# Patient Record
Sex: Female | Born: 1995 | Race: White | Hispanic: No | Marital: Single | Smoking: Never smoker
Health system: Southern US, Community
[De-identification: ages and names within clinical notes are randomized; demographics above are authoritative.]

---
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
## 2020-09-06 ENCOUNTER — Emergency Department (HOSPITAL_BASED_OUTPATIENT_CLINIC_OR_DEPARTMENT_OTHER)
Admission: EM | Admit: 2020-09-06 | Discharge: 2020-09-06 | Disposition: A | Discharge status: Home / Self Care | Payer: Managed Care, Other (non HMO) | Attending: Emergency Medicine | Admitting: Emergency Medicine

## 2020-09-06 ENCOUNTER — Other Ambulatory Visit: Payer: Self-pay

## 2020-09-06 ENCOUNTER — Emergency Department (HOSPITAL_BASED_OUTPATIENT_CLINIC_OR_DEPARTMENT_OTHER): Payer: Managed Care, Other (non HMO)

## 2020-09-06 ENCOUNTER — Encounter (HOSPITAL_BASED_OUTPATIENT_CLINIC_OR_DEPARTMENT_OTHER): Payer: Self-pay

## 2020-09-06 DIAGNOSIS — Z3A01 Less than 8 weeks gestation of pregnancy: Secondary | ICD-10-CM | POA: Insufficient documentation

## 2020-09-06 DIAGNOSIS — R1033 Periumbilical pain: Secondary | ICD-10-CM | POA: Insufficient documentation

## 2020-09-06 DIAGNOSIS — O26891 Other specified pregnancy related conditions, first trimester: Secondary | ICD-10-CM | POA: Diagnosis not present

## 2020-09-06 DIAGNOSIS — R1032 Left lower quadrant pain: Secondary | ICD-10-CM | POA: Diagnosis not present

## 2020-09-06 LAB — COMPREHENSIVE METABOLIC PANEL
ALT: 13 U/L (ref 0–44)
AST: 16 U/L (ref 15–41)
Albumin: 4.7 g/dL (ref 3.5–5.0)
Alkaline Phosphatase: 41 U/L (ref 38–126)
Anion gap: 9 (ref 5–15)
BUN: 8 mg/dL (ref 6–20)
CO2: 24 mmol/L (ref 22–32)
Calcium: 9.1 mg/dL (ref 8.9–10.3)
Chloride: 104 mmol/L (ref 98–111)
Creatinine, Ser: 0.69 mg/dL (ref 0.44–1.00)
GFR, Estimated: >60 mL/min (ref >60)
Glucose, Bld: 87 mg/dL (ref 70–99)
Potassium: 3.9 mmol/L (ref 3.5–5.1)
Sodium: 137 mmol/L (ref 135–145)
Total Bilirubin: 0.6 mg/dL (ref 0.3–1.2)
Total Protein: 7.8 g/dL (ref 6.5–8.1)

## 2020-09-06 LAB — URINALYSIS, ROUTINE W REFLEX MICROSCOPIC
Bilirubin Urine: NEGATIVE
Glucose, UA: NEGATIVE mg/dL
Hgb urine dipstick: NEGATIVE
Ketones, ur: 5 mg/dL — AB
Leukocytes,Ua: NEGATIVE
Nitrite: NEGATIVE
Protein, ur: NEGATIVE mg/dL
Specific Gravity, Urine: 1.015 (ref 1.005–1.030)
pH: 6 (ref 5.0–8.0)

## 2020-09-06 LAB — CBC
HCT: 39.6 % (ref 36.0–46.0)
Hemoglobin: 13.2 g/dL (ref 12.0–15.0)
MCH: 31.3 pg (ref 26.0–34.0)
MCHC: 33.3 g/dL (ref 30.0–36.0)
MCV: 93.8 fL (ref 80.0–100.0)
Platelets: 190 10*3/uL (ref 150–400)
RBC: 4.22 MIL/uL (ref 3.87–5.11)
RDW: 12.6 % (ref 11.5–15.5)
WBC: 8.8 10*3/uL (ref 4.0–10.5)
nRBC: 0 % (ref 0.0–0.2)

## 2020-09-06 LAB — WET PREP, GENITAL
Sperm: NONE SEEN
Trich, Wet Prep: NONE SEEN
Yeast Wet Prep HPF POC: NONE SEEN

## 2020-09-06 LAB — HCG, QUANTITATIVE, PREGNANCY: hCG, Beta Chain, Quant, S: 266 m[IU]/mL — ABNORMAL HIGH (ref <5)

## 2020-09-06 LAB — PREGNANCY, URINE: Preg Test, Ur: POSITIVE — AB

## 2020-09-06 LAB — LIPASE, BLOOD: Lipase: 24 U/L (ref 11–51)

## 2020-09-06 MED ORDER — PRENATAL COMPLETE 14-0.4 MG PO TABS: 1.0000 | ORAL_TABLET | Freq: Every day | ORAL | 0 refills | Status: AC

## 2020-09-06 NOTE — Discharge Instructions (Addendum)
Your pregnancy test today was positive.  Ultrasound did not show any evidence of an ectopic pregnancy but also did not show any intrauterine gestation and I believe this is most likely because it is early on in the pregnancy. You will need to follow-up at the women's clinic listed below for repeat blood work as well as a repeat ultrasound.  Please call to make an appointment. If your symptoms worsen and if you have any worsening abdominal pain, vaginal bleeding, lightheadedness or pelvic pain, please go to the MAU at the [facility] with the address listed below.

## 2020-09-06 NOTE — ED Triage Notes (Signed)
Pt reports she was sent here from [facility] to r/o ectopic pregnancy. Pt reports LL abd pain that started yesterday.  Pt reported that she was not aware of pregnancy prior to her arriving at [facility].

## 2020-09-06 NOTE — ED Provider Notes (Signed)
[facility] EMERGENCY DEPARTMENT Provider Note   CSN: [id number] Arrival date & time: 09/06/20  1146     History Chief Complaint  Patient presents with  . Abdominal Pain    R/o ectopic pregnacy    Kelsey Navarro is a 25 y.o. female who presents to ED with a chief complaint of abdominal pain.  States that yesterday started developing left middle and left lower quadrant abdominal pain with 1 episode of nonbloody, nonbilious emesis.  She tried taking Pepto-Bismol but she feels like this is what caused her to vomit.  Denies any vaginal discharge, abnormal vaginal bleeding or diarrhea.  She presented to urgent care, found to have a positive pregnancy test and was sent to the ER to rule out ectopic.  She states that she had 1 other pregnancy at the age of 89 which she states was not carried to term.  She denies history of ectopic pregnancy.  Last menstrual cycle ended on 08/30/2020.  No sick contacts with similar symptoms.  No concern for STDs.  No shortness of breath, chest pain, dysuria.  HPI     History reviewed. No pertinent past medical history.  There are no problems to display for this patient.   History reviewed. No pertinent surgical history.   OB History   No obstetric history on file.     History reviewed. No pertinent family history.  Social History   Tobacco Use  . Smoking status: Never Smoker  . Smokeless tobacco: Never Used  Vaping Use  . Vaping Use: Never used  Substance Use Topics  . Alcohol use: Yes    Comment: rarely  . Drug use: Never    Home Medications Prior to Admission medications   Medication Sig Start Date End Date Taking? Authorizing Provider  norgestimate-ethinyl estradiol (ORTHO-CYCLEN) 0.25-35 MG-MCG tablet Take 1 tablet by mouth daily.   Yes [provider]  Prenatal Vit-Fe Fumarate-FA (PRENATAL COMPLETE) 14-0.4 MG TABS Take 1 tablet by mouth daily. 09/06/20  Yes Aquilla Voiles, PA-C    Allergies    Patient has no  allergy information on record.  Review of Systems   Review of Systems  Constitutional: Negative for appetite change, chills and fever.  HENT: Negative for ear pain, rhinorrhea, sneezing and sore throat.   Eyes: Negative for photophobia and visual disturbance.  Respiratory: Negative for cough, chest tightness, shortness of breath and wheezing.   Cardiovascular: Negative for chest pain and palpitations.  Gastrointestinal: Positive for abdominal pain and vomiting. Negative for blood in stool, constipation, diarrhea and nausea.  Genitourinary: Negative for dysuria, hematuria and urgency.  Musculoskeletal: Negative for myalgias.  Skin: Negative for rash.  Neurological: Negative for dizziness, weakness and light-headedness.    Physical Exam Updated Vital Signs BP 121/72 (BP Location: Right Arm)   Pulse 79   Temp 98 F (36.7 C) (Oral)   Resp 18   Ht 5\' 5"  (1.651 m)   Wt 68.2 kg   LMP 08/20/2020   SpO2 100%   BMI 25.02 kg/m   Physical Exam Vitals and nursing note reviewed. Exam conducted with a chaperone present.  Constitutional:      General: She is not in acute distress.    Appearance: She is well-developed and well-nourished.  HENT:     Head: Normocephalic and atraumatic.     Nose: Nose normal.  Eyes:     General: No scleral icterus.       Left eye: No discharge.     Extraocular Movements: EOM  normal.     Conjunctiva/sclera: Conjunctivae normal.  Cardiovascular:     Rate and Rhythm: Normal rate and regular rhythm.     Pulses: Intact distal pulses.     Heart sounds: Normal heart sounds. No murmur heard. No friction rub. No gallop.   Pulmonary:     Effort: Pulmonary effort is normal. No respiratory distress.     Breath sounds: Normal breath sounds.  Abdominal:     General: Bowel sounds are normal. There is no distension.     Palpations: Abdomen is soft.     Tenderness: There is abdominal tenderness in the periumbilical area. There is no guarding.  Genitourinary:     Vagina: Vaginal discharge present.     Cervix: No cervical motion tenderness.     Uterus: Not tender.      Comments: Pelvic exam: normal external genitalia without evidence of trauma. VULVA: normal appearing vulva with no masses, tenderness or lesion. VAGINA: normal appearing vagina with normal color and discharge, no lesions. CERVIX: normal appearing cervix without lesions, cervical motion tenderness absent, cervical os closed with out purulent discharge; No vaginal discharge. Wet prep and DNA probe for chlamydia and GC obtained.   ADNEXA: normal adnexa in size, nontender and no masses UTERUS: uterus is normal size, shape, consistency and nontender.   Musculoskeletal:        General: No edema. Normal range of motion.     Cervical back: Normal range of motion and neck supple.  Skin:    General: Skin is warm and dry.     Findings: No rash.  Neurological:     Mental Status: She is alert.     Motor: No abnormal muscle tone.     Coordination: Coordination normal.  Psychiatric:        Mood and Affect: Mood and affect normal.     ED Results / Procedures / Treatments   Labs (all labs ordered are listed, but only abnormal results are displayed) Labs Reviewed  WET PREP, GENITAL - Abnormal; Notable for the following components:      Result Value   Clue Cells Wet Prep HPF POC PRESENT (*)    WBC, Wet Prep HPF POC RARE (*)    All other components within normal limits  URINALYSIS, ROUTINE W REFLEX MICROSCOPIC - Abnormal; Notable for the following components:   Ketones, ur 5 (*)    All other components within normal limits  PREGNANCY, URINE - Abnormal; Notable for the following components:   Preg Test, Ur POSITIVE (*)    All other components within normal limits  HCG, QUANTITATIVE, PREGNANCY - Abnormal; Notable for the following components:   hCG, Beta Chain, Quant, S 266 (*)    All other components within normal limits  URINE CULTURE  LIPASE, BLOOD  COMPREHENSIVE METABOLIC PANEL  CBC   GC/CHLAMYDIA PROBE AMP ([place]) NOT AT [facility]    EKG None  Radiology US OB LESS THAN 14 WEEKS WITH OB TRANSVAGINAL  Result Date: 09/06/2020 CLINICAL DATA:  Left lower quadrant pain.  Positive pregnancy test. EXAM: OBSTETRIC <14 WK [place] AND TRANSVAGINAL OB US TECHNIQUE: Both transabdominal and transvaginal ultrasound examinations were performed for complete evaluation of the gestation as well as the maternal uterus, adnexal regions, and pelvic cul-de-sac. Transvaginal technique was performed to assess early pregnancy. COMPARISON:  None. FINDINGS: Intrauterine gestational sac: None Maternal uterus/adnexae: Endometrial thickness measures 5 mm. No fibroids identified. Small left ovarian corpus luteum noted. Right ovary is normal in appearance. No adnexal masses are  identified. A small amount of free fluid is seen in the pelvic cul-de-sac. IMPRESSION: Pregnancy of unknown anatomic location (no intrauterine gestational sac or adnexal mass identified). Differential diagnosis includes recent spontaneous abortion, IUP too early to visualize, and non-visualized ectopic pregnancy. Recommend correlation with serial beta-hCG levels, and follow up US if warranted clinically. Electronically Signed   By: Danae OrleansJohn A Stahl M.D.   On: 09/06/2020 14:40    Procedures Procedures (including critical care time)  Medications Ordered in ED Medications - No data to display  ED Course  I have reviewed the triage vital signs and the nursing notes.  Pertinent labs & imaging results that were available during my care of the patient were reviewed by me and considered in my medical decision making (see chart for details).    MDM Rules/Calculators/A&P                          25 year old female presenting to the ED with a chief complaint of abdominal pain.  Sent to the ER from urgent care after having a positive pregnancy test and sent to rule out ectopic.  Lower left-sided and middle abdominal pain since yesterday with 1  episode of nonbloody, nonbilious emesis which she feels like was from the Pepto-Bismol she tried to take.  She denies any pelvic complaints, vaginal discharge, abnormal vaginal bleeding, dysuria or diarrhea.  Pelvic exam without any cervical motion tenderness, adnexal tenderness or uterine tenderness.  Abdomen is tender in the periumbilical area without rebound or guarding.  Pregnancy test here is positive with an hCG slightly elevated at 266.  Urinalysis without signs of infection was sent for culture.  Other lab work is unremarkable.  Wet prep positive for clue cells but patient asymptomatic.  Pelvic ultrasound shows pregnancy of unknown anatomic location which I feel like is most likely due to this early pregnancy.  Stressed importance of following up with OB/GYN which I have referred her to for repeat hCG and ultrasound.  In the meantime after discussion with on-call OB/GYN, patient is comfortable with not being treated for BV as these clue cells are nonspecific and she is asymptomatic without any worsening discharge or odor.  Urine was sent for culture.  She will be started on prenatal vitamins.  Feel that this is most likely related to her pregnancy.  Return precautions given.   Patient is hemodynamically stable, in NAD, and able to ambulate in the ED. Evaluation does not show pathology that would require ongoing emergent intervention or inpatient treatment. I explained the diagnosis to the patient. Pain has been managed and has no complaints prior to discharge. Patient is comfortable with above plan and is stable for discharge at this time. All questions were answered prior to disposition. Strict return precautions for returning to the ED were discussed. Encouraged follow up with PCP.   An After Visit Summary was printed and given to the patient.   Portions of this note were generated with Scientist, clinical (histocompatibility and immunogenetics)Dragon dictation software. Dictation errors may occur despite best attempts at proofreading.  Final Clinical  Impression(s) / ED Diagnoses Final diagnoses:  Less than [redacted] weeks gestation of pregnancy    Rx / DC Orders ED Discharge Orders         Ordered    Prenatal Vit-Fe Fumarate-FA (PRENATAL COMPLETE) 14-0.4 MG TABS  Daily        09/06/20 1722           Dietrich PatesKhatri, Catha Ontko, PA-C 09/06/20 1726  Sabino Donovan, MD 09/10/20 [phone]

## 2020-09-07 LAB — GC/CHLAMYDIA PROBE AMP (~~LOC~~) NOT AT ARMC
Chlamydia: NEGATIVE
Comment: NEGATIVE
Comment: NORMAL
Neisseria Gonorrhea: NEGATIVE

## 2020-09-08 LAB — URINE CULTURE

## 2020-10-27 ENCOUNTER — Other Ambulatory Visit: Payer: Self-pay

## 2020-10-27 ENCOUNTER — Emergency Department (HOSPITAL_COMMUNITY)
Admission: EM | Admit: 2020-10-27 | Discharge: 2020-10-27 | Disposition: A | Discharge status: Home / Self Care | Payer: Managed Care, Other (non HMO) | Attending: Emergency Medicine | Admitting: Emergency Medicine

## 2020-10-27 ENCOUNTER — Encounter (HOSPITAL_COMMUNITY): Payer: Self-pay

## 2020-10-27 ENCOUNTER — Emergency Department (HOSPITAL_COMMUNITY): Payer: Managed Care, Other (non HMO)

## 2020-10-27 DIAGNOSIS — O26891 Other specified pregnancy related conditions, first trimester: Secondary | ICD-10-CM | POA: Insufficient documentation

## 2020-10-27 DIAGNOSIS — R1032 Left lower quadrant pain: Secondary | ICD-10-CM

## 2020-10-27 DIAGNOSIS — R52 Pain, unspecified: Secondary | ICD-10-CM

## 2020-10-27 DIAGNOSIS — Z3A09 9 weeks gestation of pregnancy: Secondary | ICD-10-CM | POA: Diagnosis not present

## 2020-10-27 LAB — CBC WITH DIFFERENTIAL/PLATELET
Abs Immature Granulocytes: 0.01 10*3/uL (ref 0.00–0.07)
Basophils Absolute: 0.1 10*3/uL (ref 0.0–0.1)
Basophils Relative: 1 %
Eosinophils Absolute: 0.2 10*3/uL (ref 0.0–0.5)
Eosinophils Relative: 3 %
HCT: 40.2 % (ref 36.0–46.0)
Hemoglobin: 13.3 g/dL (ref 12.0–15.0)
Immature Granulocytes: 0 %
Lymphocytes Relative: 49 %
Lymphs Abs: 2.6 10*3/uL (ref 0.7–4.0)
MCH: 31.4 pg (ref 26.0–34.0)
MCHC: 33.1 g/dL (ref 30.0–36.0)
MCV: 94.8 fL (ref 80.0–100.0)
Monocytes Absolute: 0.4 10*3/uL (ref 0.1–1.0)
Monocytes Relative: 7 %
Neutro Abs: 2.2 10*3/uL (ref 1.7–7.7)
Neutrophils Relative %: 40 %
Platelets: 184 10*3/uL (ref 150–400)
RBC: 4.24 MIL/uL (ref 3.87–5.11)
RDW: 12.3 % (ref 11.5–15.5)
WBC: 5.4 10*3/uL (ref 4.0–10.5)
nRBC: 0 % (ref 0.0–0.2)

## 2020-10-27 LAB — URINALYSIS, ROUTINE W REFLEX MICROSCOPIC
Bilirubin Urine: NEGATIVE
Glucose, UA: NEGATIVE mg/dL
Hgb urine dipstick: NEGATIVE
Ketones, ur: NEGATIVE mg/dL
Leukocytes,Ua: NEGATIVE
Nitrite: NEGATIVE
Protein, ur: NEGATIVE mg/dL
Specific Gravity, Urine: 1.017 (ref 1.005–1.030)
pH: 6 (ref 5.0–8.0)

## 2020-10-27 LAB — COMPREHENSIVE METABOLIC PANEL
ALT: 10 U/L (ref 0–44)
AST: 16 U/L (ref 15–41)
Albumin: 4.3 g/dL (ref 3.5–5.0)
Alkaline Phosphatase: 51 U/L (ref 38–126)
Anion gap: 10 (ref 5–15)
BUN: 12 mg/dL (ref 6–20)
CO2: 22 mmol/L (ref 22–32)
Calcium: 8.9 mg/dL (ref 8.9–10.3)
Chloride: 104 mmol/L (ref 98–111)
Creatinine, Ser: 0.91 mg/dL (ref 0.44–1.00)
GFR, Estimated: >60 mL/min (ref >60)
Glucose, Bld: 86 mg/dL (ref 70–99)
Potassium: 3.7 mmol/L (ref 3.5–5.1)
Sodium: 136 mmol/L (ref 135–145)
Total Bilirubin: 0.6 mg/dL (ref 0.3–1.2)
Total Protein: 7.3 g/dL (ref 6.5–8.1)

## 2020-10-27 LAB — HCG, QUANTITATIVE, PREGNANCY: hCG, Beta Chain, Quant, S: 27 m[IU]/mL — ABNORMAL HIGH (ref <5)

## 2020-10-27 LAB — LIPASE, BLOOD: Lipase: 30 U/L (ref 11–51)

## 2020-10-27 LAB — I-STAT BETA HCG BLOOD, ED (MC, WL, AP ONLY): I-stat hCG, quantitative: 20.6 m[IU]/mL — ABNORMAL HIGH (ref <5)

## 2020-10-27 NOTE — ED Provider Notes (Signed)
[facility] DEPT Provider Note   CSN: [id number] Arrival date & time: 10/27/20  1456     History Chief Complaint  Patient presents with  . Abdominal Pain    Kelsey Navarro is a 25 y.o. female with no past pertinent medical history.   Patient reports that she is pregnant states that her last menstrual period was 08/20/2020.  Based on this she is [redacted] weeks pregnant.  Presents with chief complaint of lower left abdominal pain.  Patient reports that her pain began gradually yesterday.  Pain has progressively worsened since starting.  Pain is constant.  Patient rates her pain as 3/10 on the pain scale and describes it as a "cramping pressure."  Patient denies any aggravating or alleviating factors.  Patient denies any fevers, chills, nausea, vomiting, dysuria, hematuria, urinary frequency, vaginal bleeding, vaginal pain, vaginal discharge, dyspareunia, genital sores or lesions.    Patient reports that she has had similar episodes of cramping throughout this pregnancy.  Patient states that she was seen by primary care provider on 2/3 and 2/17 her ultrasounds were performed and showed no intrauterine pregnancy.  Patient reports that she has not had any vaginal bleeding with the last 9 weeks.  Patient has history of G3 P0-0-2-0.    Patient has not been taking any prenatal vitamins.  Patient is sexually active.  She is in a mutually monogamous patient is with a female partner.  HPI     History reviewed. No pertinent past medical history.  There are no problems to display for this patient.   History reviewed. No pertinent surgical history.   OB History   No obstetric history on file.     Family History  Problem Relation Age of Onset  . Healthy Mother   . Healthy Father     Social History   Tobacco Use  . Smoking status: Never Smoker  . Smokeless tobacco: Never Used  Vaping Use  . Vaping Use: Never used  Substance Use Topics  . Alcohol use:  Yes    Comment: rarely  . Drug use: Never    Home Medications Prior to Admission medications   Medication Sig Start Date End Date Taking? Authorizing Provider  norgestimate-ethinyl estradiol (ORTHO-CYCLEN) 0.25-35 MG-MCG tablet Take 1 tablet by mouth daily.    [provider]  Prenatal Vit-Fe Fumarate-FA (PRENATAL COMPLETE) 14-0.4 MG TABS Take 1 tablet by mouth daily. 09/06/20   Delia Heady, PA-C    Allergies    Patient has no known allergies.  Review of Systems   Review of Systems  Constitutional: Negative for chills and fever.  Eyes: Negative for visual disturbance.  Respiratory: Negative for shortness of breath.   Cardiovascular: Negative for chest pain.  Gastrointestinal: Positive for abdominal pain. Negative for abdominal distention, anal bleeding, blood in stool, constipation, diarrhea, nausea, rectal pain and vomiting.  Genitourinary: Negative for difficulty urinating, dyspareunia, dysuria, flank pain, frequency, genital sores, hematuria, pelvic pain, vaginal bleeding, vaginal discharge and vaginal pain.  Musculoskeletal: Negative for back pain and neck pain.  Skin: Negative for color change and rash.  Neurological: Negative for dizziness, syncope, light-headedness and headaches.  Psychiatric/Behavioral: Negative for confusion.    Physical Exam Updated Vital Signs BP 116/76   Pulse (!) 51   Temp 98 F (36.7 C) (Oral)   Resp 16   Ht 5' 5" (1.651 m)   Wt 68 kg   LMP 08/27/2020   SpO2 100%   BMI 24.96 kg/m   Physical Exam  Vitals and nursing note reviewed.  Constitutional:      General: She is not in acute distress.    Appearance: She is not ill-appearing, toxic-appearing or diaphoretic.  HENT:     Head: Normocephalic.  Eyes:     General: No scleral icterus.       Right eye: No discharge.        Left eye: No discharge.  Cardiovascular:     Rate and Rhythm: Normal rate.  Pulmonary:     Effort: Pulmonary effort is normal. No respiratory distress.      Breath sounds: No stridor.  Abdominal:     General: Abdomen is flat. Bowel sounds are normal. There is no distension. There are no signs of injury.     Palpations: There is no mass or pulsatile mass.     Tenderness: There is abdominal tenderness in the left lower quadrant. There is no right CVA tenderness, left CVA tenderness, guarding or rebound.     Hernia: There is no hernia in the umbilical area or ventral area.     Comments: Mild tenderness  Skin:    General: Skin is warm and dry.  Neurological:     General: No focal deficit present.     Mental Status: She is alert.  Psychiatric:        Behavior: Behavior is cooperative.     ED Results / Procedures / Treatments   Labs (all labs ordered are listed, but only abnormal results are displayed) Labs Reviewed  HCG, QUANTITATIVE, PREGNANCY - Abnormal; Notable for the following components:      Result Value   hCG, Beta Chain, Quant, S 27 (*)    All other components within normal limits  I-STAT BETA HCG BLOOD, ED ([facility], [facility], [facility] ONLY) - Abnormal; Notable for the following components:   I-stat hCG, quantitative 20.6 (*)    All other components within normal limits  URINALYSIS, ROUTINE W REFLEX MICROSCOPIC  COMPREHENSIVE METABOLIC PANEL  LIPASE, BLOOD  CBC WITH DIFFERENTIAL/PLATELET    EKG None  Radiology US OB LESS THAN 14 WEEKS WITH OB TRANSVAGINAL  Result Date: 10/27/2020 CLINICAL DATA:  Dropping beta HCG.  LEFT lower quadrant cramping EXAM: OBSTETRIC <14 WK [place] AND TRANSVAGINAL OB US TECHNIQUE: Transvaginal ultrasound was performed for complete evaluation of the gestation as well as the maternal uterus, adnexal regions, and pelvic cul-de-sac. COMPARISON:  September 06, 2020. FINDINGS: Intrauterine gestational sac: Not visualized. Right ovary: Normal sonographic appearance of the RIGHT ovary with several scattered follicles. It measures 3.1 x 1.7 by 2.7 cm for a volume of 7.3 ML. Left ovary: In the LEFT adnexa along the anterior aspect of  the LEFT ovary there is a heterogeneous hyperechoic masslike area which measures 1.9 x 2.7 x 2.2 cm. It is unclear on provided images whether this arises from the ovary or is immediately adjacent. No provocative maneuvers were performed. Normal LEFT ovary with several scattered follicles measures 2.4 x 1.5 x 1.7 cm. Free fluid:  Small volume free fluid. IMPRESSION: 1. No intrauterine gestational sac, yolk sac, or fetal pole identified. In the setting of positive pregnancy test and no definite intrauterine pregnancy, this reflects a pregnancy of unknown location. Differential considerations include early normal IUP, abnormal IUP, or nonvisualized ectopic pregnancy. Differentiation is achieved with serial beta HCG supplemented by repeat sonography as clinically warranted. 2. In the LEFT adnexa, there is a heterogeneous hyperechoic 2.7 cm masslike area along the superior aspect of the LEFT ovary. It is unclear on the  provided images whether this arises from the ovary or is immediately adjacent. Differential considerations include ectopic pregnancy or hemorrhagic cyst (possibly hemorrhagic corpus luteal cyst given similar location to prior ultrasound). Recommend short-term follow-up ultrasound and OB/GYN evaluation. Electronically Signed   By: Valentino Saxon MD   On: 10/27/2020 19:27    Procedures Procedures   Medications Ordered in ED Medications - No data to display  ED Course  I have reviewed the triage vital signs and the nursing notes.  Pertinent labs & imaging results that were available during my care of the patient were reviewed by me and considered in my medical decision making (see chart for details).    MDM Rules/Calculators/A&P                          Alert 25 year old female no acute distress, nontoxic-appearing.  Patient presents with chief complaint of lower left quadrant abdominal pain.  Patient reports that she was seen in January for similar complaint and found to be  pregnant.  Patient reports that she has had similar episodes of cramping throughout this pregnancy.  Patient states that she was seen by primary care provider on 2/3 and 2/17 where ultrasounds were performed and showed no intrauterine pregnancy.  Patient denies any fevers, chills, vaginal bleeding, vaginal discharge.  Pain was gradual in onset.  We will obtain CMP, lipase, UA, i-STAT beta hCG, quantitative hCG.  I-STAT beta hCG was ordered to confirm pregnancy.  And found to be elevated at 20.6. Quantitative beta-hCG found to be 27; this is decreased from Previous value obtained 1 month prior which was 266.  Based on patient's LMP she is [redacted] weeks pregnant.  This value is markedly decreased from expected hCG levels for 9-week pregnancy.  Lipase is within normal limits low suspicion for acute pancreatitis. AST, ALT, alk phos, total bili all within normal limits; low suspicion for hepatobiliary disease. Low suspicion for acute appendicitis as patient has no tenderness to McBurney's point, negative psoas sign, no leukocytosis, and is afebrile. Urinalysis shows no signs of infection.  Due to patient's decreased beta-hCG levels and complaint of lower left quadrant abdominal pain will obtain ultrasound to assess for ectopic pregnancy. Ultrasound showed:1.No intrauterine gestational sac, yolk sac, or fetal pole identified. In the setting of positive pregnancy test and no definite intrauterine pregnancy, this reflects a pregnancy of unknown location. Differential considerations include early normal IUP, abnormal IUP, or nonvisualized ectopic pregnancy. Differentiation is achieved with serial beta HCG supplemented by repeat sonography as clinically warranted. 2. In the LEFT adnexa, there is a heterogeneous hyperechoic 2.7 cm masslike area along the superior aspect of the LEFT ovary. It is unclear on the provided images whether this arises from the ovary or is immediately adjacent. Differential  considerations include ectopic pregnancy or hemorrhagic cyst (possibly hemorrhagic corpus luteal cyst given similar location to prior ultrasound). Recommend short-term follow-up ultrasound and OB/GYN evaluation.  Do to results of ultrasound will consult on-call OB/GYN.  79 Spoke with on call OBGYN, [place], who reviewed patients labs and imaging.  He advised that the patient will be called on Monday to come into the [facility] for repeat bHCG testing.  He advised that if she had any worsening of her pain to return to the emergency department.    Discussed results, findings, treatment and follow up. Patient advised of return precautions. Patient verbalized understanding and agreed with plan.   Final Clinical Impression(s) / ED Diagnoses Final diagnoses:  [redacted] weeks gestation of pregnancy  Left lower quadrant abdominal pain    Rx / DC Orders ED Discharge Orders    None       Dyann Ruddle 10/28/20 1638    Blanchie Dessert, MD 10/28/20 2213

## 2020-10-27 NOTE — Discharge Instructions (Addendum)
You came into the emergency department today to be evaluated for your abdominal pain.  Your lab work showed your beta hCG, the hormone used to check pregnancy, was decreased from when it was checked 1 month ago.  The ultrasound performed showed no intrauterine pregnancy.  This indicates that your pregnancy was likely a miscarriage.  Spoke with the on-call OB/GYN provider who reported that someone will call you on Monday or Tuesday have you return to the wound center for repeat testing of your beta-hCG.    If you have any worsening of your pain please return to the emergency department for further assessment.

## 2020-10-27 NOTE — ED Triage Notes (Signed)
Patient states she has had constant pressure LLQ since yesterday Patient  also reports that she tested + for pregnancy at the end of January. Patient reports that she has had 3 [place] and 3 pregnancy tests due to c/o abdominal cramping and ws told there was nothing wrong.

## 2020-10-29 ENCOUNTER — Encounter: Payer: Self-pay | Admitting: *Deleted

## 2020-10-29 ENCOUNTER — Other Ambulatory Visit: Payer: Self-pay

## 2020-10-29 ENCOUNTER — Ambulatory Visit (INDEPENDENT_AMBULATORY_CARE_PROVIDER_SITE_OTHER): Payer: Managed Care, Other (non HMO) | Admitting: *Deleted

## 2020-10-29 VITALS — BP 127/80 | HR 79 | Ht 65.0 in | Wt 147.5 lb

## 2020-10-29 DIAGNOSIS — O039 Complete or unspecified spontaneous abortion without complication: Secondary | ICD-10-CM

## 2020-10-29 DIAGNOSIS — O3680X0 Pregnancy with inconclusive fetal viability, not applicable or unspecified: Secondary | ICD-10-CM | POA: Diagnosis not present

## 2020-10-29 LAB — BETA HCG QUANT (REF LAB): hCG Quant: 15 m[IU]/mL

## 2020-10-29 NOTE — Progress Notes (Signed)
Patient was assessed and managed by nursing staff during this encounter. I have reviewed the chart and agree with the documentation and plan. I have also made any necessary editorial changes.  Patient to return for quant HCG later this week or next week. Findings highly suspicious for spontaneous miscarriage  Kelsey Antigua, MD 10/29/2020 4:10 PM

## 2020-10-29 NOTE — Progress Notes (Signed)
Pt presents for stat BHCG as scheduled. Pt states LMP 08/20/20. She was initially seen @ [facility] ED on 1/13 and had BHCG 266 however [place] negative for IUP. She stated that she did not have an Ob/Gyn doctor so then was seen @ [facility] on 2/3 and also 2/17. On both dates, she had [place] which did not show a pregnancy despite +UPT. Pt states that she did not have any blood drawn for lab test. Today, she reports having a light pressure feeling on LLQ which is the same as when she was seen on 3/5 @ [facility]. She denies pain or bleeding. Pt was advised that she will be called with test result today and next steps in plan of care. She stated that a detailed message can be left on her voicemail if she does not answer.   [id number]  BHCG results reviewed by Dr. Jolayne Panther who finds decrease in HCG consistent with miscarriage. Pt was called and advised of test result which most likely indicates miscarriage. She was also informed of need for repeat BHCG in 4-7 days in order to follow hormone level down to <5. Pt voiced understanding. She stated she would like to have appt on 3/14 @ 2:10 pm.

## 2020-10-29 NOTE — Addendum Note (Signed)
Addended by: Jill Side on: 10/29/2020 04:30 PM   Modules accepted: Orders

## 2020-10-30 ENCOUNTER — Other Ambulatory Visit: Payer: Self-pay | Admitting: Lactation Services

## 2020-11-01 ENCOUNTER — Other Ambulatory Visit: Payer: Medicaid Other

## 2021-05-27 IMAGING — US US OB < 14 WEEKS - US OB TV
1 series · 14 of 28 positions shown · non-contrast
Comparison: None.

CLINICAL DATA: Left lower quadrant pain.  Positive pregnancy test.

EXAM:
OBSTETRIC <14 WK US AND TRANSVAGINAL OB US
TECHNIQUE: Both transabdominal and transvaginal ultrasound examinations were
performed for complete evaluation of the gestation as well as the
maternal uterus, adnexal regions, and pelvic cul-de-sac.
Transvaginal technique was performed to assess early pregnancy.

[Series 1: us ob < 14 weeks - us ob tv · 48 acquisitions, 14 frames shown]
[im 2/48]
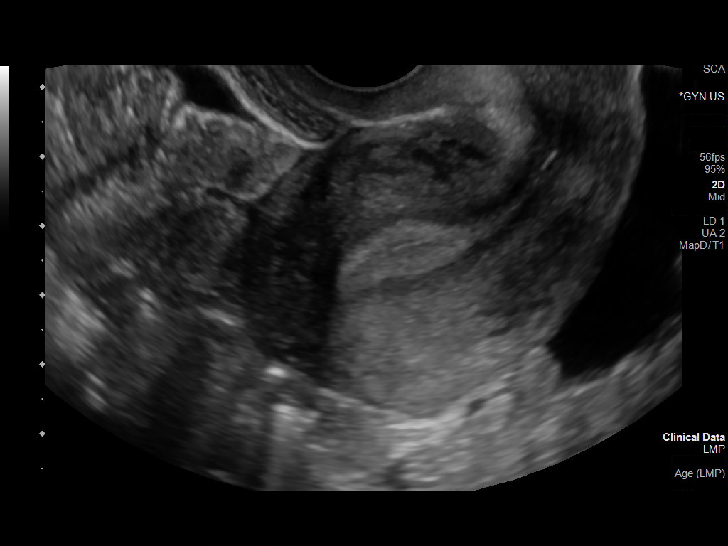
[im 6/48]
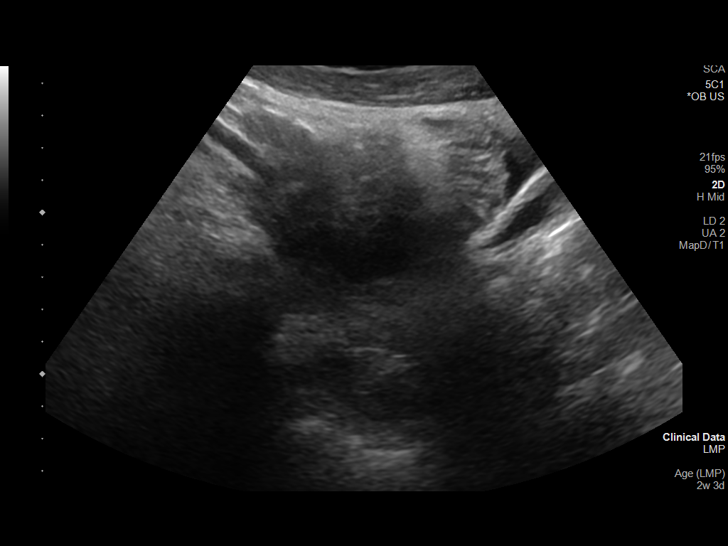
[im 9/48]
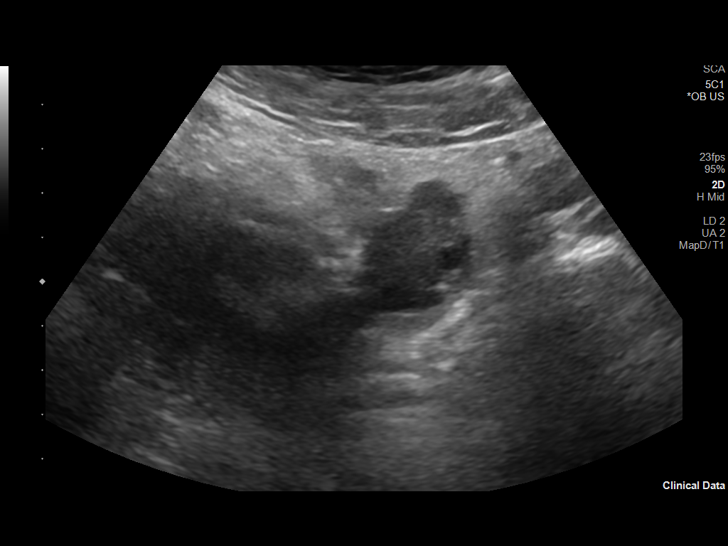
[im 13/48]
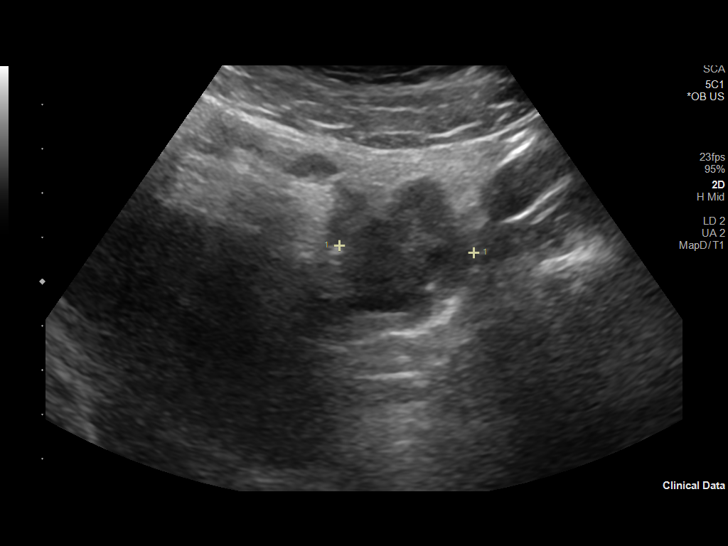
[im 16/48]
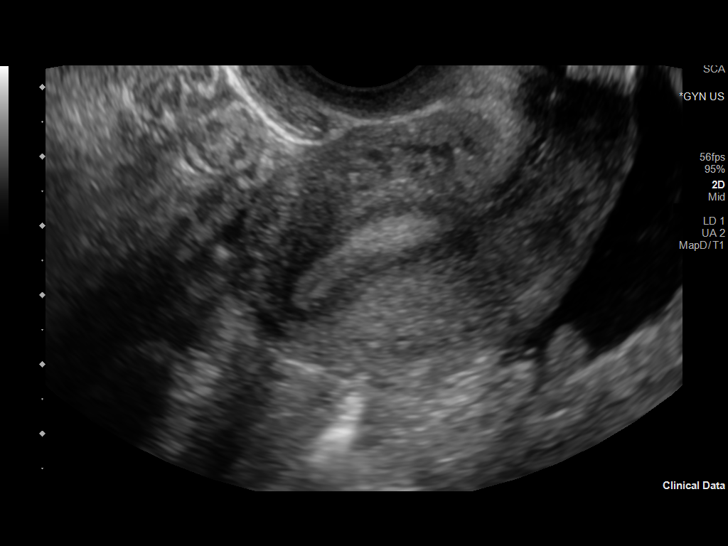
[im 20/48]
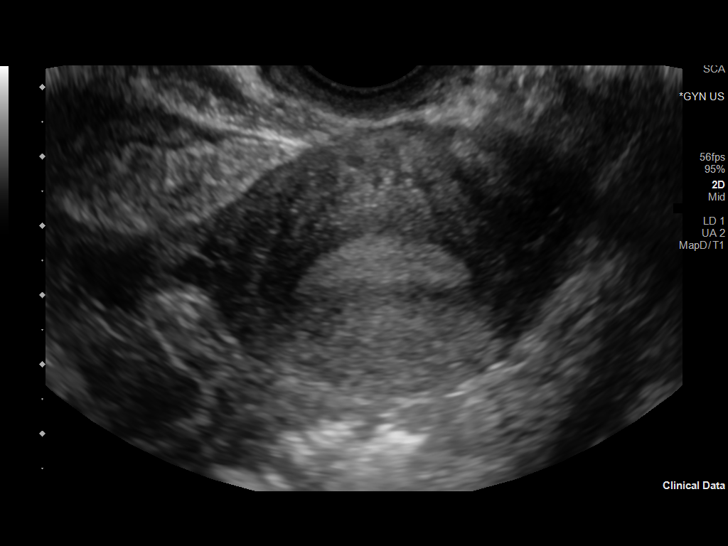
[im 23/48]
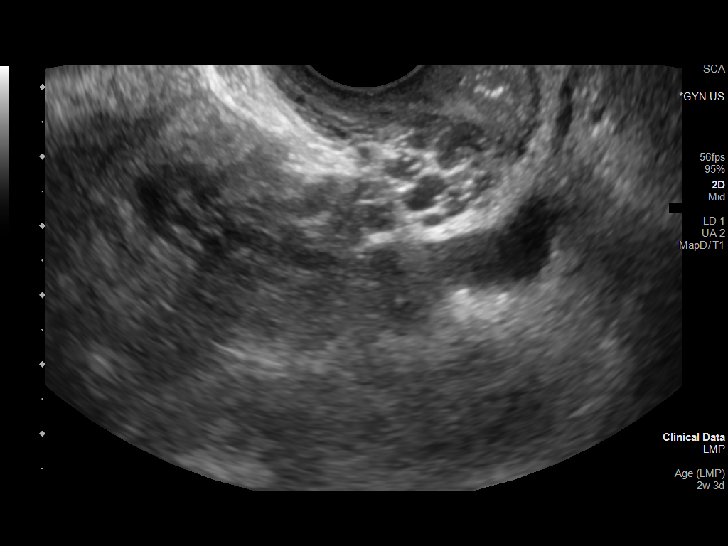
[im 27/48]
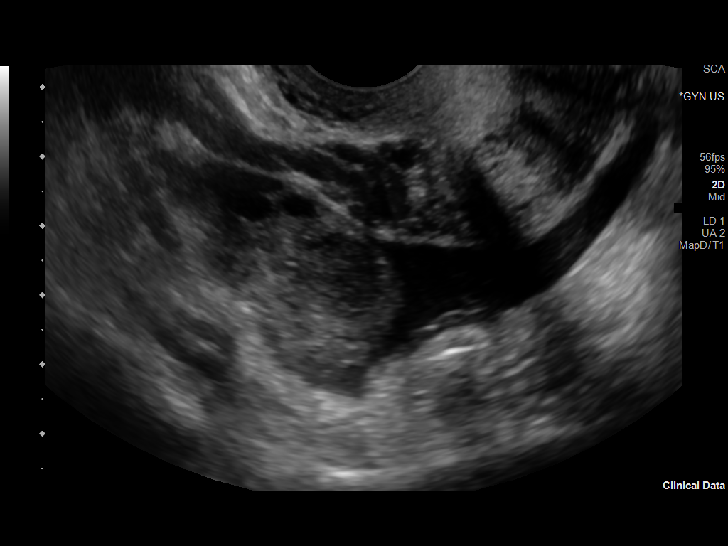
[im 30/48]
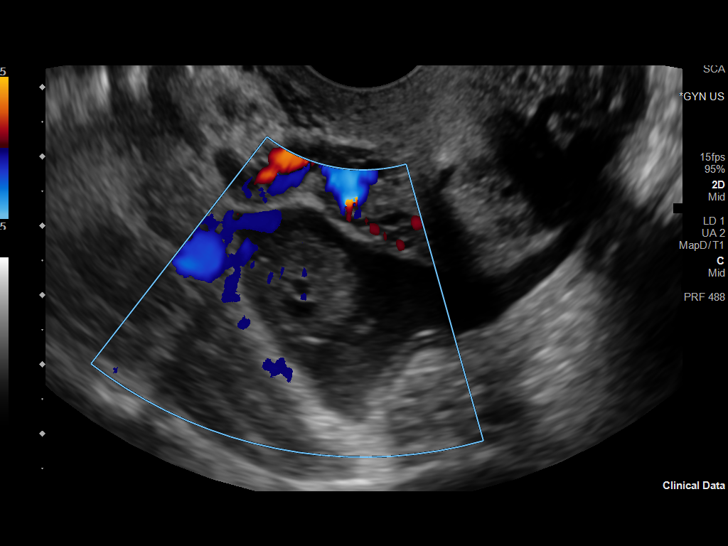
[im 34/48]
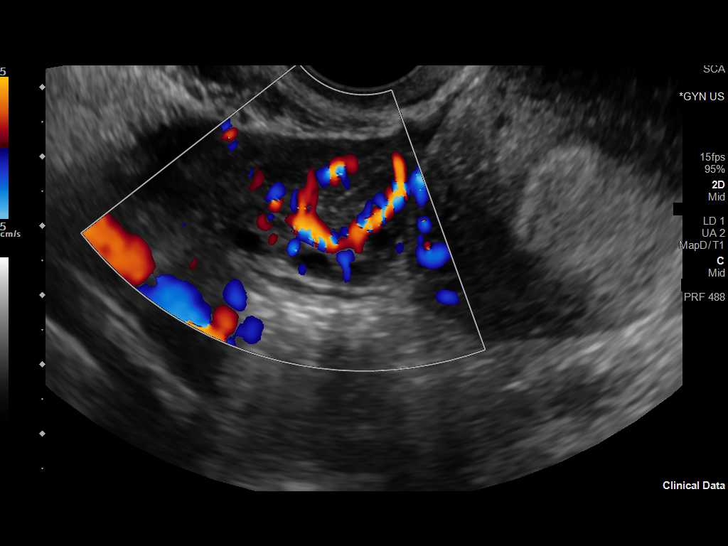
[im 37/48]
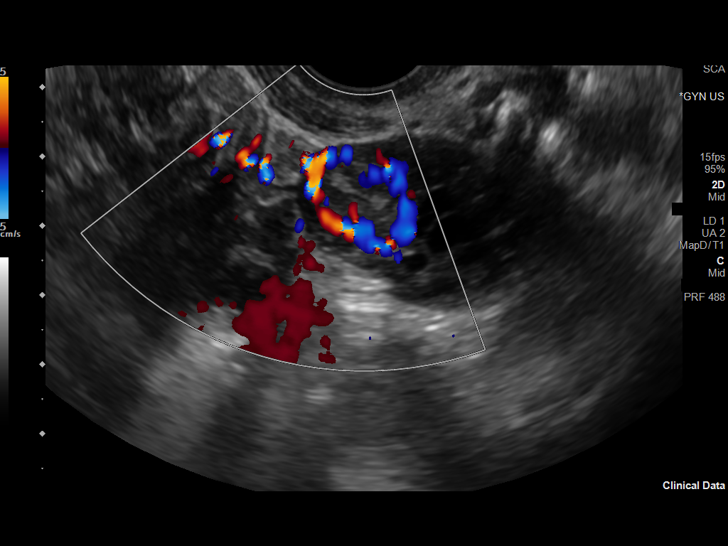
[im 41/48]
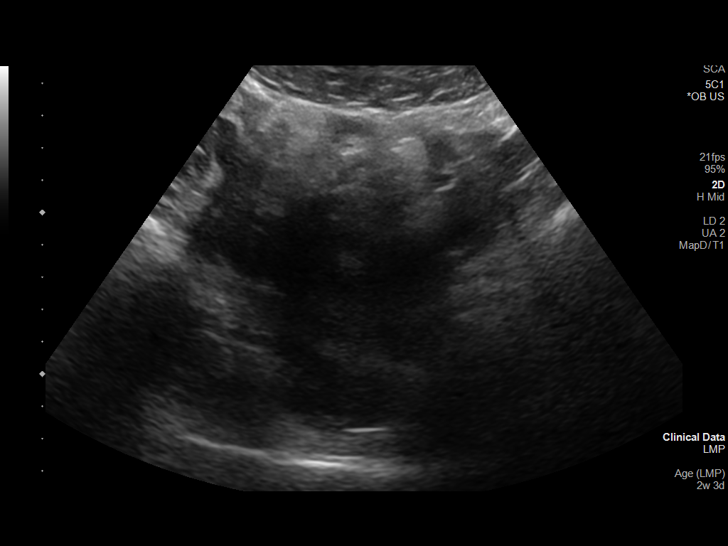
[im 44/48]
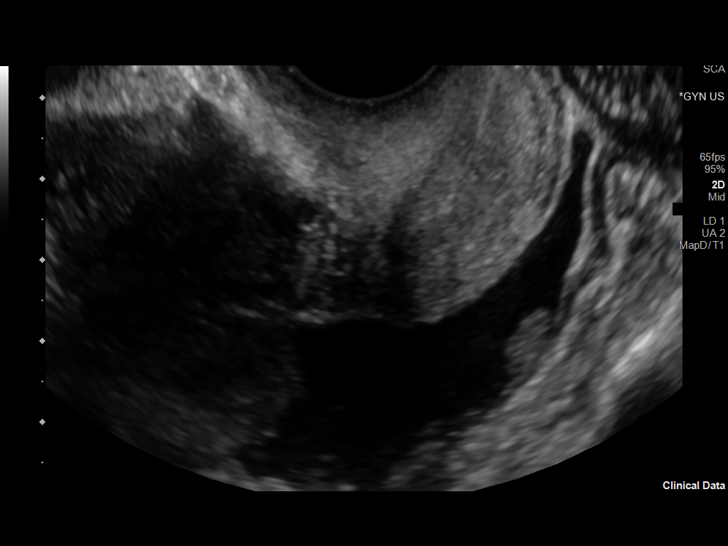
[im 48/48]
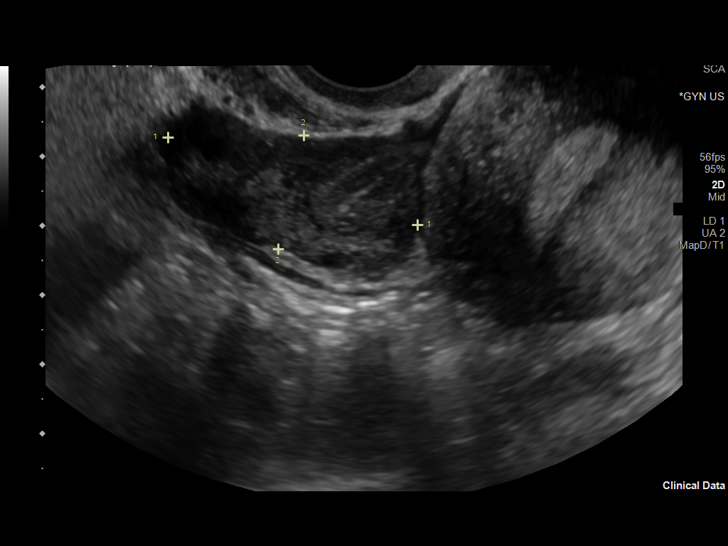

[14 of 28 positions shown; findings below may reference images not displayed]

FINDINGS: Intrauterine gestational sac: None

Maternal uterus/adnexae: Endometrial thickness measures 5 mm. No
fibroids identified. Small left ovarian corpus luteum noted. Right
ovary is normal in appearance. No adnexal masses are identified. A
small amount of free fluid is seen in the pelvic cul-de-sac.
IMPRESSION: Pregnancy of unknown anatomic location (no intrauterine gestational
sac or adnexal mass identified). Differential diagnosis includes
recent spontaneous abortion, IUP too early to visualize, and
non-visualized ectopic pregnancy. Recommend correlation with serial
beta-hCG levels, and follow up US if warranted clinically.

## 2022-08-20 ENCOUNTER — Telehealth: Payer: Self-pay

## 2022-08-20 NOTE — Telephone Encounter (Signed)
LVM. AS, CMA 

## 2023-01-06 ENCOUNTER — Telehealth: Payer: Self-pay

## 2023-01-06 NOTE — Telephone Encounter (Signed)
LVM for patient to call back. AS, CMA
